# Patient Record
Sex: Male | Born: 1975 | Race: Black or African American | Hispanic: No | Marital: Married | Smoking: Never smoker
Health system: Southern US, Community
[De-identification: ages and names within clinical notes are randomized; demographics above are authoritative.]

## PROBLEM LIST (undated history)

## (undated) DIAGNOSIS — J342 Deviated nasal septum: Secondary | ICD-10-CM

---

## 2014-05-28 ENCOUNTER — Encounter (HOSPITAL_COMMUNITY): Payer: Self-pay | Admitting: Emergency Medicine

## 2014-05-28 ENCOUNTER — Emergency Department (HOSPITAL_COMMUNITY)
Admission: EM | Admit: 2014-05-28 | Discharge: 2014-05-29 | Disposition: A | Discharge status: Home / Self Care | Payer: 59 | Attending: Emergency Medicine | Admitting: Emergency Medicine

## 2014-05-28 DIAGNOSIS — S161XXA Strain of muscle, fascia and tendon at neck level, initial encounter: Secondary | ICD-10-CM

## 2014-05-28 DIAGNOSIS — S80212A Abrasion, left knee, initial encounter: Secondary | ICD-10-CM | POA: Insufficient documentation

## 2014-05-28 DIAGNOSIS — Y92410 Unspecified street and highway as the place of occurrence of the external cause: Secondary | ICD-10-CM | POA: Diagnosis not present

## 2014-05-28 DIAGNOSIS — Y9389 Activity, other specified: Secondary | ICD-10-CM | POA: Insufficient documentation

## 2014-05-28 DIAGNOSIS — Y998 Other external cause status: Secondary | ICD-10-CM | POA: Insufficient documentation

## 2014-05-28 DIAGNOSIS — S6991XA Unspecified injury of right wrist, hand and finger(s), initial encounter: Secondary | ICD-10-CM | POA: Insufficient documentation

## 2014-05-28 DIAGNOSIS — S3992XA Unspecified injury of lower back, initial encounter: Secondary | ICD-10-CM | POA: Diagnosis not present

## 2014-05-28 DIAGNOSIS — S199XXA Unspecified injury of neck, initial encounter: Secondary | ICD-10-CM | POA: Diagnosis present
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
## 2014-05-28 NOTE — ED Notes (Signed)
C- collar applied at triage . 

## 2014-05-29 ENCOUNTER — Emergency Department (HOSPITAL_COMMUNITY): Payer: 59

## 2014-05-29 DIAGNOSIS — S161XXA Strain of muscle, fascia and tendon at neck level, initial encounter: Secondary | ICD-10-CM | POA: Diagnosis not present

## 2014-05-29 MED ORDER — HYDROCODONE-ACETAMINOPHEN 5-325 MG PO TABS: 1.0000 | ORAL_TABLET | Freq: Four times a day (QID) | ORAL | Status: DC | PRN

## 2014-05-29 MED ORDER — HYDROCODONE-ACETAMINOPHEN 5-325 MG PO TABS
2.0000 | ORAL_TABLET | Freq: Once | ORAL | Status: AC
Start: 2014-05-29 — End: 2014-05-29
  Administered 2014-05-29: 2 via ORAL
  Filled 2014-05-29: qty 2
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
## 2014-05-29 NOTE — Discharge Instructions (Signed)
Cervical Strain and Sprain (Whiplash) °with Rehab °Cervical strain and sprain are injuries that commonly occur with "whiplash" injuries. Whiplash occurs when the neck is forcefully whipped backward or forward, such as during a motor vehicle accident or during contact sports. The muscles, ligaments, tendons, discs, and nerves of the neck are susceptible to injury when this occurs. °RISK FACTORS °Risk of having a whiplash injury increases if: °· Osteoarthritis of the spine. °· Situations that make head or neck accidents or trauma more likely. °· High-risk sports (football, rugby, wrestling, hockey, auto racing, gymnastics, diving, contact karate, or boxing). °· Poor strength and flexibility of the neck. °· Previous neck injury. °· Poor tackling technique. °· Improperly fitted or padded equipment. °SYMPTOMS  °· Pain or stiffness in the front or back of neck or both. °· Symptoms may present immediately or up to 24 hours after injury. °· Dizziness, headache, nausea, and vomiting. °· Muscle spasm with soreness and stiffness in the neck. °· Tenderness and swelling at the injury site. °PREVENTION °· Learn and use proper technique (avoid tackling with the head, spearing, and head-butting; use proper falling techniques to avoid landing on the head). °· Warm up and stretch properly before activity. °· Maintain physical fitness: °· Strength, flexibility, and endurance. °· Cardiovascular fitness. °· Wear properly fitted and padded protective equipment, such as padded soft collars, for participation in contact sports. °PROGNOSIS  °Recovery from cervical strain and sprain injuries is dependent on the extent of the injury. These injuries are usually curable in 1 week to 3 months with appropriate treatment.  °RELATED COMPLICATIONS  °· Temporary numbness and weakness may occur if the nerve roots are damaged, and this may persist until the nerve has completely healed. °· Chronic pain due to frequent recurrence of  symptoms. °· Prolonged healing, especially if activity is resumed too soon (before complete recovery). °TREATMENT  °Treatment initially involves the use of ice and medication to help reduce pain and inflammation. It is also important to perform strengthening and stretching exercises and modify activities that worsen symptoms so the injury does not get worse. These exercises may be performed at home or with a therapist. For patients who experience severe symptoms, a soft, padded collar may be recommended to be worn around the neck.  °Improving your posture may help reduce symptoms. Posture improvement includes pulling your chin and abdomen in while sitting or standing. If you are sitting, sit in a firm chair with your buttocks against the back of the chair. While sleeping, try replacing your pillow with a small towel rolled to 2 inches in diameter, or use a cervical pillow or soft cervical collar. Poor sleeping positions delay healing.  °For patients with nerve root damage, which causes numbness or weakness, the use of a cervical traction apparatus may be recommended. Surgery is rarely necessary for these injuries. However, cervical strain and sprains that are present at birth (congenital) may require surgery. °MEDICATION  °· If pain medication is necessary, nonsteroidal anti-inflammatory medications, such as aspirin and ibuprofen, or other minor pain relievers, such as acetaminophen, are often recommended. °· Do not take pain medication for 7 days before surgery. °· Prescription pain relievers may be given if deemed necessary by your caregiver. Use only as directed and only as much as you need. °HEAT AND COLD:  °· Cold treatment (icing) relieves pain and reduces inflammation. Cold treatment should be applied for 10 to 15 minutes every 2 to 3 hours for inflammation and pain and immediately after any activity that aggravates   your symptoms. Use ice packs or an ice massage. °· Heat treatment may be used prior to  performing the stretching and strengthening activities prescribed by your caregiver, physical therapist, or athletic trainer. Use a heat pack or a warm soak. °SEEK MEDICAL CARE IF:  °· Symptoms get worse or do not improve in 2 weeks despite treatment. °· New, unexplained symptoms develop (drugs used in treatment may produce side effects). °EXERCISES °RANGE OF MOTION (ROM) AND STRETCHING EXERCISES - Cervical Strain and Sprain °These exercises may help you when beginning to rehabilitate your injury. In order to successfully resolve your symptoms, you must improve your posture. These exercises are designed to help reduce the forward-head and rounded-shoulder posture which contributes to this condition. Your symptoms may resolve with or without further involvement from your physician, physical therapist or athletic trainer. While completing these exercises, remember:  °· Restoring tissue flexibility helps normal motion to return to the joints. This allows healthier, less painful movement and activity. °· An effective stretch should be held for at least 20 seconds, although you may need to begin with shorter hold times for comfort. °· A stretch should never be painful. You should only feel a gentle lengthening or release in the stretched tissue. °STRETCH- Axial Extensors °· Lie on your back on the floor. You may bend your knees for comfort. Place a rolled-up hand towel or dish towel, about 2 inches in diameter, under the part of your head that makes contact with the floor. °· Gently tuck your chin, as if trying to make a "double chin," until you feel a gentle stretch at the base of your head. °· Hold __________ seconds. °Repeat __________ times. Complete this exercise __________ times per day.  °STRETCH - Axial Extension  °· Stand or sit on a firm surface. Assume a good posture: chest up, shoulders drawn back, abdominal muscles slightly tense, knees unlocked (if standing) and feet hip width apart. °· Slowly retract your  chin so your head slides back and your chin slightly lowers. Continue to look straight ahead. °· You should feel a gentle stretch in the back of your head. Be certain not to feel an aggressive stretch since this can cause headaches later. °· Hold for __________ seconds. °Repeat __________ times. Complete this exercise __________ times per day. °STRETCH - Cervical Side Bend  °· Stand or sit on a firm surface. Assume a good posture: chest up, shoulders drawn back, abdominal muscles slightly tense, knees unlocked (if standing) and feet hip width apart. °· Without letting your nose or shoulders move, slowly tip your right / left ear to your shoulder until your feel a gentle stretch in the muscles on the opposite side of your neck. °· Hold __________ seconds. °Repeat __________ times. Complete this exercise __________ times per day. °STRETCH - Cervical Rotators  °· Stand or sit on a firm surface. Assume a good posture: chest up, shoulders drawn back, abdominal muscles slightly tense, knees unlocked (if standing) and feet hip width apart. °· Keeping your eyes level with the ground, slowly turn your head until you feel a gentle stretch along the back and opposite side of your neck. °· Hold __________ seconds. °Repeat __________ times. Complete this exercise __________ times per day. °RANGE OF MOTION - Neck Circles  °· Stand or sit on a firm surface. Assume a good posture: chest up, shoulders drawn back, abdominal muscles slightly tense, knees unlocked (if standing) and feet hip width apart. °· Gently roll your head down and around from the   back of one shoulder to the back of the other. The motion should never be forced or painful. °· Repeat the motion 10-20 times, or until you feel the neck muscles relax and loosen. °Repeat __________ times. Complete the exercise __________ times per day. °STRENGTHENING EXERCISES - Cervical Strain and Sprain °These exercises may help you when beginning to rehabilitate your injury. They may  resolve your symptoms with or without further involvement from your physician, physical therapist, or athletic trainer. While completing these exercises, remember:  °· Muscles can gain both the endurance and the strength needed for everyday activities through controlled exercises. °· Complete these exercises as instructed by your physician, physical therapist, or athletic trainer. Progress the resistance and repetitions only as guided. °· You may experience muscle soreness or fatigue, but the pain or discomfort you are trying to eliminate should never worsen during these exercises. If this pain does worsen, stop and make certain you are following the directions exactly. If the pain is still present after adjustments, discontinue the exercise until you can discuss the trouble with your clinician. °STRENGTH - Cervical Flexors, Isometric °· Face a wall, standing about 6 inches away. Place a small pillow, a ball about 6-8 inches in diameter, or a folded towel between your forehead and the wall. °· Slightly tuck your chin and gently push your forehead into the soft object. Push only with mild to moderate intensity, building up tension gradually. Keep your jaw and forehead relaxed. °· Hold 10 to 20 seconds. Keep your breathing relaxed. °· Release the tension slowly. Relax your neck muscles completely before you start the next repetition. °Repeat __________ times. Complete this exercise __________ times per day. °STRENGTH- Cervical Lateral Flexors, Isometric  °· Stand about 6 inches away from a wall. Place a small pillow, a ball about 6-8 inches in diameter, or a folded towel between the side of your head and the wall. °· Slightly tuck your chin and gently tilt your head into the soft object. Push only with mild to moderate intensity, building up tension gradually. Keep your jaw and forehead relaxed. °· Hold 10 to 20 seconds. Keep your breathing relaxed. °· Release the tension slowly. Relax your neck muscles completely  before you start the next repetition. °Repeat __________ times. Complete this exercise __________ times per day. °STRENGTH - Cervical Extensors, Isometric  °· Stand about 6 inches away from a wall. Place a small pillow, a ball about 6-8 inches in diameter, or a folded towel between the back of your head and the wall. °· Slightly tuck your chin and gently tilt your head back into the soft object. Push only with mild to moderate intensity, building up tension gradually. Keep your jaw and forehead relaxed. °· Hold 10 to 20 seconds. Keep your breathing relaxed. °· Release the tension slowly. Relax your neck muscles completely before you start the next repetition. °Repeat __________ times. Complete this exercise __________ times per day. °POSTURE AND BODY MECHANICS CONSIDERATIONS - Cervical Strain and Sprain °Keeping correct posture when sitting, standing or completing your activities will reduce the stress put on different body tissues, allowing injured tissues a chance to heal and limiting painful experiences. The following are general guidelines for improved posture. Your physician or physical therapist will provide you with any instructions specific to your needs. While reading these guidelines, remember: °· The exercises prescribed by your provider will help you have the flexibility and strength to maintain correct postures. °· The correct posture provides the optimal environment for your joints to   work. All of your joints have less wear and tear when properly supported by a spine with good posture. This means you will experience a healthier, less painful body. °· Correct posture must be practiced with all of your activities, especially prolonged sitting and standing. Correct posture is as important when doing repetitive low-stress activities (typing) as it is when doing a single heavy-load activity (lifting). °PROLONGED STANDING WHILE SLIGHTLY LEANING FORWARD °When completing a task that requires you to lean  forward while standing in one place for a long time, place either foot up on a stationary 2- to 4-inch high object to help maintain the best posture. When both feet are on the ground, the low back tends to lose its slight inward curve. If this curve flattens (or becomes too large), then the back and your other joints will experience too much stress, fatigue more quickly, and can cause pain.  °RESTING POSITIONS °Consider which positions are most painful for you when choosing a resting position. If you have pain with flexion-based activities (sitting, bending, stooping, squatting), choose a position that allows you to rest in a less flexed posture. You would want to avoid curling into a fetal position on your side. If your pain worsens with extension-based activities (prolonged standing, working overhead), avoid resting in an extended position such as sleeping on your stomach. Most people will find more comfort when they rest with their spine in a more neutral position, neither too rounded nor too arched. Lying on a non-sagging bed on your side with a pillow between your knees, or on your back with a pillow under your knees will often provide some relief. Keep in mind, being in any one position for a prolonged period of time, no matter how correct your posture, can still lead to stiffness. °WALKING °Walk with an upright posture. Your ears, shoulders, and hips should all line up. °OFFICE WORK °When working at a desk, create an environment that supports good, upright posture. Without extra support, muscles fatigue and lead to excessive strain on joints and other tissues. °CHAIR: °· A chair should be able to slide under your desk when your back makes contact with the back of the chair. This allows you to work closely. °· The chair's height should allow your eyes to be level with the upper part of your monitor and your hands to be slightly lower than your elbows. °· Body position: °¨ Your feet should make contact with the  floor. If this is not possible, use a foot rest. °¨ Keep your ears over your shoulders. This will reduce stress on your neck and low back. °Document Released: 04/17/2005 Document Revised: 09/01/2013 Document Reviewed: 07/30/2008 °[employer]® Patient Information ©2015 [employer]. This information is not intended to replace advice given to you by your health care provider. Make sure you discuss any questions you have with your health care provider. °Motor Vehicle Collision °It is common to have multiple bruises and sore muscles after a motor vehicle collision (MVC). These tend to feel worse for the first 24 hours. You may have the most stiffness and soreness over the first several hours. You may also feel worse when you wake up the first morning after your collision. After this point, you will usually begin to improve with each day. The speed of improvement often depends on the severity of the collision, the number of injuries, and the location and nature of these injuries. °HOME CARE INSTRUCTIONS °· Put ice on the injured area. °¨ Put ice in a   plastic bag. °¨ Place a towel between your skin and the bag. °¨ Leave the ice on for 15-20 minutes, 3-4 times a day, or as directed by your health care provider. °· Drink enough fluids to keep your urine clear or pale yellow. Do not drink alcohol. °· Take a warm shower or bath once or twice a day. This will increase blood flow to sore muscles. °· You may return to activities as directed by your caregiver. Be careful when lifting, as this may aggravate neck or back pain. °· Only take over-the-counter or prescription medicines for pain, discomfort, or fever as directed by your caregiver. Do not use aspirin. This may increase bruising and bleeding. °SEEK IMMEDIATE MEDICAL CARE IF: °· You have numbness, tingling, or weakness in the arms or legs. °· You develop severe headaches not relieved with medicine. °· You have severe neck pain, especially tenderness in the middle of the back  of your neck. °· You have changes in bowel or bladder control. °· There is increasing pain in any area of the body. °· You have shortness of breath, light-headedness, dizziness, or fainting. °· You have chest pain. °· You feel sick to your stomach (nauseous), throw up (vomit), or sweat. °· You have increasing abdominal discomfort. °· There is blood in your urine, stool, or vomit. °· You have pain in your shoulder (shoulder strap areas). °· You feel your symptoms are getting worse. °MAKE SURE YOU: °· Understand these instructions. °· Will watch your condition. °· Will get help right away if you are not doing well or get worse. °Document Released: 04/17/2005 Document Revised: 09/01/2013 Document Reviewed: 09/14/2010 °[employer]® Patient Information ©2015 [employer]. This information is not intended to replace advice given to you by your health care provider. Make sure you discuss any questions you have with your health care provider. ° °
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
## 2021-02-10 ENCOUNTER — Other Ambulatory Visit: Payer: Self-pay

## 2021-02-10 ENCOUNTER — Ambulatory Visit (INDEPENDENT_AMBULATORY_CARE_PROVIDER_SITE_OTHER): Payer: BC Managed Care – PPO | Admitting: Medical

## 2021-02-10 VITALS — BP 120/80 | HR 85 | Ht 74.0 in | Wt 187.0 lb

## 2021-02-10 DIAGNOSIS — Z23 Encounter for immunization: Secondary | ICD-10-CM

## 2021-02-10 DIAGNOSIS — J309 Allergic rhinitis, unspecified: Secondary | ICD-10-CM | POA: Diagnosis not present

## 2021-02-10 DIAGNOSIS — Z0000 Encounter for general adult medical examination without abnormal findings: Secondary | ICD-10-CM

## 2021-02-10 DIAGNOSIS — J3489 Other specified disorders of nose and nasal sinuses: Secondary | ICD-10-CM

## 2021-02-10 DIAGNOSIS — Z13220 Encounter for screening for lipoid disorders: Secondary | ICD-10-CM

## 2021-02-10 MED ORDER — AMOXICILLIN 875 MG PO TABS: 875.0000 mg | ORAL_TABLET | Freq: Two times a day (BID) | ORAL | 0 refills | Status: AC

## 2021-02-10 NOTE — Progress Notes (Signed)
Subjective:   HPI  Juan Gutierrez is a 45 y.o. male who presents for Chief Complaint  Patient presents with   get established    Get established. Sinus issues since march/April- tried over the counter meds but still having pressure    Patient Care Team: Hiyab Nhem, Cleda Mccreedy as PCP - General (Family Medicine) Sees dentist Sees eye doctor  Concerns: New patient today to establish care.  No routine care in 10 years or more.  He is in good health except for sinus issues.  Ever since he and his family moved to [place] he has had problems with allergies.  Since March or April of this year he has had continual problems with head congestion, nasal congestion.  He has tried over-the-counter antihistamines, nasal sprays, decongestants, but still has continued head pressure and nasal congestion.  He has had a sinus infection in the past.  No nosebleeds.  No significant headaches.  Reviewed their medical, surgical, family, social, medication, and allergy history and updated chart as appropriate.  No past medical history on file.  No past surgical history on file.  No family history on file.   Current Outpatient Medications:    amoxicillin (AMOXIL) 875 MG tablet, Take 1 tablet (875 mg total) by mouth 2 (two) times daily for 10 days., Disp: 20 tablet, Rfl: 0   Fexofenadine-Pseudoephedrine (ALLEGRA-D PO), Take by mouth., Disp: , Rfl:    fluticasone (FLONASE) 50 MCG/ACT nasal spray, Place into both nostrils daily., Disp: , Rfl:    Oxymetazoline HCl (NASAL SPRAY NA), Place into the nose., Disp: , Rfl:   No Known Allergies   Review of Systems Constitutional: -fever, -chills, -sweats, -unexpected weight change, -decreased appetite, -fatigue Allergy: -sneezing, -itching, -congestion Dermatology: -changing moles, --rash, -lumps ENT: -runny nose, -ear pain, -sore throat, -hoarseness, +sinus pain, -teeth pain, - ringing in ears, -hearing loss, -nosebleeds Cardiology: -chest pain,  -palpitations, -swelling, -difficulty breathing when lying flat, -waking up short of breath Respiratory: -cough, -shortness of breath, -difficulty breathing with exercise or exertion, -wheezing, -coughing up blood Gastroenterology: -abdominal pain, -nausea, -vomiting, -diarrhea, -constipation, -blood in stool, -changes in bowel movement, -difficulty swallowing or eating Hematology: -bleeding, -bruising  Musculoskeletal: -joint aches, -muscle aches, -joint swelling, -back pain, -neck pain, -cramping, -changes in gait Ophthalmology: denies vision changes, eye redness, itching, discharge Urology: -burning with urination, -difficulty urinating, -blood in urine, -urinary frequency, -urgency, -incontinence Neurology: -headache, -weakness, -tingling, -numbness, -memory loss, -falls, -dizziness Psychology: -depressed mood, -agitation, -sleep problems Male GU: no testicular mass, pain, no lymph nodes swollen, no swelling, no rash.  Depression screen [facility] 2/9 02/10/2021  Decreased Interest 0  Down, Depressed, Hopeless 0  PHQ - 2 Score 0        Objective:  BP 120/80   Pulse 85   Ht 6\' 2"  (1.88 m)   Wt 187 lb (84.8 kg)   BMI 24.01 kg/m   General appearance: alert, no distress, WD/WN, African American male Skin: unremarkable HEENT: normocephalic, conjunctiva/corneas normal, sclerae anicteric, PERRLA, EOMi, nares patent, +turbinate edema bilat, pink mucosa, pharynx normal Oral cavity: MMM, tongue normal, teeth normal Neck: supple, no lymphadenopathy, no thyromegaly, no masses, normal ROM, no bruits Chest: non tender, normal shape and expansion Heart: RRR, normal S1, S2, no murmurs Lungs: CTA bilaterally, no wheezes, rhonchi, or rales Abdomen: +bs, soft, non tender, non distended, no masses, no hepatomegaly, no splenomegaly, no bruits Back: non tender, normal ROM, no scoliosis Musculoskeletal: upper extremities non tender, no obvious deformity, normal ROM throughout, lower extremities  non  tender, no obvious deformity, normal ROM throughout Extremities: no edema, no cyanosis, no clubbing Pulses: 2+ symmetric, upper and lower extremities, normal cap refill Neurological: alert, oriented x 3, CN2-12 intact, strength normal upper extremities and lower extremities, sensation normal throughout, DTRs 2+ throughout, no cerebellar signs, gait normal Psychiatric: normal affect, behavior normal, pleasant  GU: normal male external genitalia,circumcised, nontender, no masses, no hernia, no lymphadenopathy Rectal: deferred  Assessment and Plan :   Encounter Diagnoses  Name Primary?   Encounter for health maintenance examination in adult Yes   Need for influenza vaccination    Sinus pressure    Chronic allergic rhinitis    Screening for lipid disorders     This visit was a preventative care visit, also known as wellness visit or routine physical.   Topics typically include healthy lifestyle, diet, exercise, preventative care, vaccinations, sick and well care, proper use of emergency dept and after hours care, as well as other concerns.     Recommendations: Continue to return yearly for your annual wellness and preventative care visits.  This gives [place] a chance to discuss healthy lifestyle, exercise, vaccinations, review your chart record, and perform screenings where appropriate.  I recommend you see your eye doctor yearly for routine vision care.  I recommend you see your dentist yearly for routine dental care including hygiene visits twice yearly.   Vaccination recommendations were reviewed Immunization History  Administered Date(s) Administered   Influenza,inj,Quad PF,6+ Mos 02/10/2021   PFIZER(Purple Top)SARS-COV-2 Vaccination 01/09/2021, 01/30/2021   Counseled on the influenza virus vaccine.  Vaccine information sheet given.  Influenza vaccine given after consent obtained.  Advised updated tetanus.   He will consider    Screening for cancer: Colon cancer screening: Age  89  Testicular cancer screening You should do a monthly self testicular exam if you are between 65-58 years old  We discussed PSA, prostate exam, and prostate cancer screening risks/benefits.   Age 89  Skin cancer screening: Check your skin regularly for new changes, growing lesions, or other lesions of concern Come in for evaluation if you have skin lesions of concern.  Lung cancer screening: If you have a greater than 20 pack year history of tobacco use, then you may qualify for lung cancer screening with a chest CT scan.   Please call your insurance company to inquire about coverage for this test.  We currently don't have screenings for other cancers besides breast, cervical, colon, and lung cancers.  If you have a strong family history of cancer or have other cancer screening concerns, please let me know.    Bone health: Get at least 150 minutes of aerobic exercise weekly Get weight bearing exercise at least once weekly Bone density test:  A bone density test is an imaging test that uses a type of X-ray to measure the amount of calcium and other minerals in your bones. The test may be used to diagnose or screen you for a condition that causes weak or thin bones (osteoporosis), predict your risk for a broken bone (fracture), or determine how well your osteoporosis treatment is working. The bone density test is recommended for females 65 and older, or females or males <65 if certain risk factors such as thyroid disease, long term use of steroids such as for asthma or rheumatological issues, vitamin D deficiency, estrogen deficiency, family history of osteoporosis, self or family history of fragility fracture in first degree relative.    Heart health: Get at least 150 minutes of  aerobic exercise weekly Limit alcohol It is important to maintain a healthy blood pressure and healthy cholesterol numbers  Heart disease screening: Screening for heart disease includes screening for blood  pressure, fasting lipids, glucose/diabetes screening, BMI height to weight ratio, reviewed of smoking status, physical activity, and diet.    Goals include blood pressure 120/80 or less, maintaining a healthy lipid/cholesterol profile, preventing diabetes or keeping diabetes numbers under good control, not smoking or using tobacco products, exercising most days per week or at least 150 minutes per week of exercise, and eating healthy variety of fruits and vegetables, healthy oils, and avoiding unhealthy food choices like fried food, fast food, high sugar and high cholesterol foods.    Other tests may possibly include EKG test, CT coronary calcium score, echocardiogram, exercise treadmill stress test.    Medical care options: I recommend you continue to seek care here first for routine care.  We try really hard to have available appointments Monday through Friday daytime hours for sick visits, acute visits, and physicals.  Urgent care should be used for after hours and weekends for significant issues that cannot wait till the next day.  The emergency department should be used for significant potentially life-threatening emergencies.  The emergency department is expensive, can often have long wait times for less significant concerns, so try to utilize primary care, urgent care, or telemedicine when possible to avoid unnecessary trips to the emergency department.  Virtual visits and telemedicine have been introduced since the pandemic started in 2020, and can be convenient ways to receive medical care.  We offer virtual appointments as well to assist you in a variety of options to seek medical care.    Separate significant issues discussed: Chronic allergy and sinus problems-begin trial of amoxicillin.  Discussed proper use of nasal sprays.  Go back on nasal spray and allergy pill instead of decongestants.  If not much improved within the next 1.5 months recheck or we consider ear nose and throat  specialist or allergist   Return for fasting labs   Brigham was seen today for get established.  Diagnoses and all orders for this visit:  Encounter for health maintenance examination in adult -     Comprehensive metabolic panel; Future -     CBC with Differential/Platelet; Future -     Lipid panel; Future -     TSH; Future -     Urinalysis -     Cancel: POCT Urinalysis DIP (Proadvantage Device)  Need for influenza vaccination -     Flu Vaccine QUAD 6+ mos PF IM (Fluarix Quad PF)  Sinus pressure  Chronic allergic rhinitis  Screening for lipid disorders -     Lipid panel; Future  Other orders -     amoxicillin (AMOXIL) 875 MG tablet; Take 1 tablet (875 mg total) by mouth 2 (two) times daily for 10 days.   Follow-up pending labs, yearly for physical

## 2021-02-11 LAB — URINALYSIS
Bilirubin, UA: NEGATIVE
Glucose, UA: NEGATIVE
Ketones, UA: NEGATIVE
Leukocytes,UA: NEGATIVE
Nitrite, UA: NEGATIVE
Protein,UA: NEGATIVE
RBC, UA: NEGATIVE
Specific Gravity, UA: 1.017 (ref 1.005–1.030)
Urobilinogen, Ur: 0.2 mg/dL (ref 0.2–1.0)
pH, UA: 7 (ref 5.0–7.5)

## 2021-02-17 ENCOUNTER — Other Ambulatory Visit: Payer: Self-pay

## 2021-02-17 ENCOUNTER — Other Ambulatory Visit: Payer: BC Managed Care – PPO

## 2021-02-17 DIAGNOSIS — Z0000 Encounter for general adult medical examination without abnormal findings: Secondary | ICD-10-CM | POA: Diagnosis not present

## 2021-02-17 DIAGNOSIS — Z13220 Encounter for screening for lipoid disorders: Secondary | ICD-10-CM | POA: Diagnosis not present

## 2021-02-18 LAB — CBC WITH DIFFERENTIAL/PLATELET
Basophils Absolute: 0 10*3/uL (ref 0.0–0.2)
Basos: 1 %
EOS (ABSOLUTE): 0.2 10*3/uL (ref 0.0–0.4)
Eos: 4 %
Hematocrit: 42.3 % (ref 37.5–51.0)
Hemoglobin: 13.9 g/dL (ref 13.0–17.7)
Immature Grans (Abs): 0 10*3/uL (ref 0.0–0.1)
Immature Granulocytes: 0 %
Lymphocytes Absolute: 1.5 10*3/uL (ref 0.7–3.1)
Lymphs: 33 %
MCH: 27.6 pg (ref 26.6–33.0)
MCHC: 32.9 g/dL (ref 31.5–35.7)
MCV: 84 fL (ref 79–97)
Monocytes Absolute: 0.5 10*3/uL (ref 0.1–0.9)
Monocytes: 11 %
Neutrophils Absolute: 2.2 10*3/uL (ref 1.4–7.0)
Neutrophils: 51 %
Platelets: 301 10*3/uL (ref 150–450)
RBC: 5.03 x10E6/uL (ref 4.14–5.80)
RDW: 12.8 % (ref 11.6–15.4)
WBC: 4.4 10*3/uL (ref 3.4–10.8)

## 2021-02-18 LAB — LIPID PANEL
Chol/HDL Ratio: 3.8 ratio (ref 0.0–5.0)
Cholesterol, Total: 207 mg/dL — ABNORMAL HIGH (ref 100–199)
HDL: 54 mg/dL (ref >39)
LDL Chol Calc (NIH): 134 mg/dL — ABNORMAL HIGH (ref 0–99)
Triglycerides: 108 mg/dL (ref 0–149)
VLDL Cholesterol Cal: 19 mg/dL (ref 5–40)

## 2021-02-18 LAB — COMPREHENSIVE METABOLIC PANEL
ALT: 26 IU/L (ref 0–44)
AST: 31 IU/L (ref 0–40)
Albumin/Globulin Ratio: 1.9 (ref 1.2–2.2)
Albumin: 5 g/dL (ref 4.0–5.0)
Alkaline Phosphatase: 55 IU/L (ref 44–121)
BUN/Creatinine Ratio: 10 (ref 9–20)
BUN: 12 mg/dL (ref 6–24)
Bilirubin Total: 0.3 mg/dL (ref 0.0–1.2)
CO2: 25 mmol/L (ref 20–29)
Calcium: 10.1 mg/dL (ref 8.7–10.2)
Chloride: 99 mmol/L (ref 96–106)
Creatinine, Ser: 1.19 mg/dL (ref 0.76–1.27)
Globulin, Total: 2.6 g/dL (ref 1.5–4.5)
Glucose: 92 mg/dL (ref 70–99)
Potassium: 4.6 mmol/L (ref 3.5–5.2)
Sodium: 138 mmol/L (ref 134–144)
Total Protein: 7.6 g/dL (ref 6.0–8.5)
eGFR: 77 mL/min/{1.73_m2} (ref >59)

## 2021-02-18 LAB — TSH: TSH: 2.04 u[IU]/mL (ref 0.450–4.500)

## 2021-02-23 ENCOUNTER — Other Ambulatory Visit: Payer: Self-pay

## 2021-02-23 ENCOUNTER — Ambulatory Visit (INDEPENDENT_AMBULATORY_CARE_PROVIDER_SITE_OTHER): Payer: BC Managed Care – PPO | Admitting: Medical

## 2021-02-23 ENCOUNTER — Ambulatory Visit: Payer: BC Managed Care – PPO | Admitting: Medical

## 2021-02-23 VITALS — BP 110/70 | Wt 183.0 lb

## 2021-02-23 DIAGNOSIS — J343 Hypertrophy of nasal turbinates: Secondary | ICD-10-CM

## 2021-02-23 DIAGNOSIS — J309 Allergic rhinitis, unspecified: Secondary | ICD-10-CM

## 2021-02-23 MED ORDER — IPRATROPIUM BROMIDE 0.06 % NA SOLN: 1.0000 | Freq: Three times a day (TID) | NASAL | 0 refills | Status: DC

## 2021-02-23 NOTE — Progress Notes (Signed)
Subjective:  Juan Gutierrez is a 45 y.o. male who presents for Chief Complaint  Patient presents with   recheck sinuses    Recheck sinuses- nothing has changed . Pressure and would like referral to ENT     Here for recheck on allergies and sinuses.  I saw him recently as a new patient for physical.  At that time the was having chronic allergy problems ever since moving to [place] but was also having more of a sinus pressure and thick mucus that had changed recently.  Last visit he started on amoxicillin, discussed proper use of Flonase and continuing allergy pill.  He is doing all those things.  He finished the antibiotic.  He even has been doing Nettie pot.  He notes zero improvement.  no other aggravating or relieving factors.  No other c/o.  The following portions of the patient's history were reviewed and updated as appropriate: allergies, current medications, past family history, past medical history, past social history, past surgical history and problem list.  ROS Otherwise as in subjective above  Objective: BP 110/70   Wt 183 lb (83 kg)   BMI 23.50 kg/m   General appearance: alert, no distress, well developed, well nourished HEENT: normocephalic, sclerae anicteric, conjunctiva pink and moist, TMs pearly, nares with significant turbinate hypertrophy, but no discharge or erythema, pharynx normal Oral cavity: MMM, no lesions Neck: supple, no lymphadenopathy, no thyromegaly, no masses   Assessment: Encounter Diagnoses  Name Primary?   Nasal turbinate hypertrophy Yes   Chronic allergic rhinitis      Plan: Continue allergy pill, continue Nettie pot, switch to Atrovent or can use Atrovent nasal and Flonase alternating at different times per day.  Hopefully this will give some benefit.  We will go ahead and refer to ear nose and throat  Juan Gutierrez was seen today for recheck sinuses.  Diagnoses and all orders for this visit:  Nasal turbinate hypertrophy -     Ambulatory  referral to ENT  Chronic allergic rhinitis -     Ambulatory referral to ENT  Other orders -     ipratropium (ATROVENT) 0.06 % nasal spray; Place 1 spray into both nostrils 3 (three) times daily.    Follow up: prn

## 2021-03-15 DIAGNOSIS — J342 Deviated nasal septum: Secondary | ICD-10-CM | POA: Diagnosis not present

## 2021-03-15 DIAGNOSIS — J310 Chronic rhinitis: Secondary | ICD-10-CM | POA: Diagnosis not present

## 2021-03-15 DIAGNOSIS — J343 Hypertrophy of nasal turbinates: Secondary | ICD-10-CM | POA: Diagnosis not present

## 2021-03-23 ENCOUNTER — Other Ambulatory Visit: Payer: Self-pay | Admitting: Otolaryngology

## 2021-04-04 ENCOUNTER — Encounter (HOSPITAL_BASED_OUTPATIENT_CLINIC_OR_DEPARTMENT_OTHER): Payer: Self-pay | Admitting: Otolaryngology

## 2021-04-04 ENCOUNTER — Other Ambulatory Visit: Payer: Self-pay

## 2021-04-11 ENCOUNTER — Encounter (HOSPITAL_BASED_OUTPATIENT_CLINIC_OR_DEPARTMENT_OTHER)
Admission: RE | Disposition: A | Discharge status: Home / Self Care | Payer: Self-pay | Source: Home / Self Care | Attending: Otolaryngology

## 2021-04-11 ENCOUNTER — Other Ambulatory Visit: Payer: Self-pay

## 2021-04-11 ENCOUNTER — Ambulatory Visit (HOSPITAL_BASED_OUTPATIENT_CLINIC_OR_DEPARTMENT_OTHER)
Admission: RE | Admit: 2021-04-11 | Discharge: 2021-04-11 | Disposition: A | Discharge status: Home / Self Care | Payer: BC Managed Care – PPO | Attending: Otolaryngology | Admitting: Otolaryngology

## 2021-04-11 ENCOUNTER — Encounter (HOSPITAL_BASED_OUTPATIENT_CLINIC_OR_DEPARTMENT_OTHER): Payer: Self-pay | Admitting: Otolaryngology

## 2021-04-11 ENCOUNTER — Ambulatory Visit (HOSPITAL_BASED_OUTPATIENT_CLINIC_OR_DEPARTMENT_OTHER): Payer: BC Managed Care – PPO | Admitting: Certified Registered"

## 2021-04-11 DIAGNOSIS — J310 Chronic rhinitis: Secondary | ICD-10-CM | POA: Insufficient documentation

## 2021-04-11 DIAGNOSIS — J3489 Other specified disorders of nose and nasal sinuses: Secondary | ICD-10-CM | POA: Diagnosis not present

## 2021-04-11 DIAGNOSIS — J342 Deviated nasal septum: Secondary | ICD-10-CM | POA: Diagnosis not present

## 2021-04-11 DIAGNOSIS — J343 Hypertrophy of nasal turbinates: Secondary | ICD-10-CM | POA: Insufficient documentation

## 2021-04-11 HISTORY — PX: NASAL SEPTOPLASTY W/ TURBINOPLASTY: SHX2070

## 2021-04-11 HISTORY — DX: Deviated nasal septum: J34.2

## 2021-04-11 SURGERY — SEPTOPLASTY, NOSE, WITH NASAL TURBINATE REDUCTION
Anesthesia: General | Site: Nose | Laterality: Bilateral

## 2021-04-11 MED ORDER — DEXAMETHASONE SODIUM PHOSPHATE 10 MG/ML IJ SOLN
INTRAMUSCULAR | Status: AC
  Filled 2021-04-11: qty 1

## 2021-04-11 MED ORDER — ROCURONIUM BROMIDE 100 MG/10ML IV SOLN
INTRAVENOUS | Status: DC | PRN
  Administered 2021-04-11: 50 mg via INTRAVENOUS

## 2021-04-11 MED ORDER — MIDAZOLAM HCL 2 MG/2ML IJ SOLN
INTRAMUSCULAR | Status: AC
  Filled 2021-04-11: qty 2

## 2021-04-11 MED ORDER — MUPIROCIN 2 % EX OINT
TOPICAL_OINTMENT | CUTANEOUS | Status: AC
  Filled 2021-04-11: qty 22

## 2021-04-11 MED ORDER — HYDROMORPHONE HCL 1 MG/ML IJ SOLN
0.2500 mg | INTRAMUSCULAR | Status: DC | PRN
  Administered 2021-04-11: 0.5 mg via INTRAVENOUS

## 2021-04-11 MED ORDER — OXYCODONE HCL 5 MG PO TABS: 5.0000 mg | ORAL_TABLET | Freq: Once | ORAL | Status: DC | PRN

## 2021-04-11 MED ORDER — OXYCODONE HCL 5 MG/5ML PO SOLN: 5.0000 mg | Freq: Once | ORAL | Status: DC | PRN

## 2021-04-11 MED ORDER — FENTANYL CITRATE (PF) 100 MCG/2ML IJ SOLN
INTRAMUSCULAR | Status: AC
  Filled 2021-04-11: qty 2

## 2021-04-11 MED ORDER — LIDOCAINE HCL (CARDIAC) PF 100 MG/5ML IV SOSY
PREFILLED_SYRINGE | INTRAVENOUS | Status: DC | PRN
  Administered 2021-04-11: 100 mg via INTRAVENOUS

## 2021-04-11 MED ORDER — CEFAZOLIN SODIUM 1 G IJ SOLR
INTRAMUSCULAR | Status: AC
  Filled 2021-04-11: qty 20

## 2021-04-11 MED ORDER — MUPIROCIN 2 % EX OINT
TOPICAL_OINTMENT | CUTANEOUS | Status: DC | PRN
  Administered 2021-04-11: 1 via TOPICAL

## 2021-04-11 MED ORDER — LIDOCAINE-EPINEPHRINE 1 %-1:100000 IJ SOLN
INTRAMUSCULAR | Status: DC | PRN
  Administered 2021-04-11: 3 mL

## 2021-04-11 MED ORDER — ONDANSETRON HCL 4 MG/2ML IJ SOLN
INTRAMUSCULAR | Status: AC
  Filled 2021-04-11: qty 2

## 2021-04-11 MED ORDER — FENTANYL CITRATE (PF) 100 MCG/2ML IJ SOLN
INTRAMUSCULAR | Status: DC | PRN
  Administered 2021-04-11: 100 ug via INTRAVENOUS
  Administered 2021-04-11 (×2): 50 ug via INTRAVENOUS

## 2021-04-11 MED ORDER — PROMETHAZINE HCL 25 MG/ML IJ SOLN: 6.2500 mg | INTRAMUSCULAR | Status: DC | PRN

## 2021-04-11 MED ORDER — DEXMEDETOMIDINE (PRECEDEX) IN NS 20 MCG/5ML (4 MCG/ML) IV SYRINGE
PREFILLED_SYRINGE | INTRAVENOUS | Status: AC
  Filled 2021-04-11: qty 5

## 2021-04-11 MED ORDER — LIDOCAINE 2% (20 MG/ML) 5 ML SYRINGE
INTRAMUSCULAR | Status: AC
  Filled 2021-04-11: qty 5

## 2021-04-11 MED ORDER — OXYMETAZOLINE HCL 0.05 % NA SOLN
NASAL | Status: DC | PRN
  Administered 2021-04-11: 1 via TOPICAL

## 2021-04-11 MED ORDER — PROPOFOL 10 MG/ML IV BOLUS
INTRAVENOUS | Status: AC
  Filled 2021-04-11: qty 20

## 2021-04-11 MED ORDER — ROCURONIUM BROMIDE 10 MG/ML (PF) SYRINGE
PREFILLED_SYRINGE | INTRAVENOUS | Status: AC
  Filled 2021-04-11: qty 10

## 2021-04-11 MED ORDER — OXYMETAZOLINE HCL 0.05 % NA SOLN
NASAL | Status: AC
  Filled 2021-04-11: qty 30

## 2021-04-11 MED ORDER — MIDAZOLAM HCL 5 MG/5ML IJ SOLN
INTRAMUSCULAR | Status: DC | PRN
  Administered 2021-04-11: 2 mg via INTRAVENOUS

## 2021-04-11 MED ORDER — DEXMEDETOMIDINE (PRECEDEX) IN NS 20 MCG/5ML (4 MCG/ML) IV SYRINGE
PREFILLED_SYRINGE | INTRAVENOUS | Status: DC | PRN
  Administered 2021-04-11: 8 ug via INTRAVENOUS
  Administered 2021-04-11 (×3): 4 ug via INTRAVENOUS

## 2021-04-11 MED ORDER — SUGAMMADEX SODIUM 200 MG/2ML IV SOLN
INTRAVENOUS | Status: DC | PRN
  Administered 2021-04-11: 331.6 mg via INTRAVENOUS

## 2021-04-11 MED ORDER — HYDROMORPHONE HCL 1 MG/ML IJ SOLN
INTRAMUSCULAR | Status: AC
  Filled 2021-04-11: qty 0.5

## 2021-04-11 MED ORDER — SCOPOLAMINE 1 MG/3DAYS TD PT72
MEDICATED_PATCH | TRANSDERMAL | Status: AC
  Filled 2021-04-11: qty 1

## 2021-04-11 MED ORDER — LACTATED RINGERS IV SOLN: INTRAVENOUS | Status: DC

## 2021-04-11 MED ORDER — AMISULPRIDE (ANTIEMETIC) 5 MG/2ML IV SOLN: 10.0000 mg | Freq: Once | INTRAVENOUS | Status: DC | PRN

## 2021-04-11 MED ORDER — OXYCODONE-ACETAMINOPHEN 5-325 MG PO TABS: 1.0000 | ORAL_TABLET | ORAL | 0 refills | Status: AC | PRN

## 2021-04-11 MED ORDER — LIDOCAINE-EPINEPHRINE 1 %-1:100000 IJ SOLN
INTRAMUSCULAR | Status: AC
  Filled 2021-04-11: qty 2

## 2021-04-11 MED ORDER — DEXAMETHASONE SODIUM PHOSPHATE 4 MG/ML IJ SOLN
INTRAMUSCULAR | Status: DC | PRN
  Administered 2021-04-11: 10 mg via INTRAVENOUS

## 2021-04-11 MED ORDER — AMOXICILLIN 875 MG PO TABS
875.0000 mg | ORAL_TABLET | Freq: Two times a day (BID) | ORAL | 0 refills | Status: AC
Start: 2021-04-11 — End: 2021-04-14

## 2021-04-11 MED ORDER — PROPOFOL 10 MG/ML IV BOLUS
INTRAVENOUS | Status: DC | PRN
  Administered 2021-04-11: 15 mg via INTRAVENOUS

## 2021-04-11 MED ORDER — CEFAZOLIN SODIUM-DEXTROSE 2-3 GM-%(50ML) IV SOLR
INTRAVENOUS | Status: DC | PRN
  Administered 2021-04-11: 2 g via INTRAVENOUS

## 2021-04-11 MED ORDER — MEPERIDINE HCL 25 MG/ML IJ SOLN: 6.2500 mg | INTRAMUSCULAR | Status: DC | PRN

## 2021-04-11 SURGICAL SUPPLY — 33 items
ATTRACTOMAT 16X20 MAGNETIC DRP (DRAPES) IMPLANT
CANISTER SUCT 1200ML W/VALVE (MISCELLANEOUS) ×2 IMPLANT
COAGULATOR SUCT 8FR VV (MISCELLANEOUS) ×2 IMPLANT
DECANTER SPIKE VIAL GLASS SM (MISCELLANEOUS) IMPLANT
DEFOGGER MIRROR 1QT (MISCELLANEOUS) ×2 IMPLANT
DRSG NASOPORE 8CM (GAUZE/BANDAGES/DRESSINGS) IMPLANT
DRSG TELFA 3X8 NADH (GAUZE/BANDAGES/DRESSINGS) IMPLANT
ELECT REM PT RETURN 9FT ADLT (ELECTROSURGICAL) ×2
ELECTRODE REM PT RTRN 9FT ADLT (ELECTROSURGICAL) ×1 IMPLANT
GLOVE SURG ENC MOIS LTX SZ7.5 (GLOVE) ×2 IMPLANT
GLOVE SURG POLYISO LF SZ7 (GLOVE) ×1 IMPLANT
GLOVE SURG UNDER POLY LF SZ7 (GLOVE) ×1 IMPLANT
GOWN STRL REUS W/ TWL LRG LVL3 (GOWN DISPOSABLE) ×2 IMPLANT
GOWN STRL REUS W/TWL LRG LVL3 (GOWN DISPOSABLE) ×4
NDL HYPO 25X1 1.5 SAFETY (NEEDLE) ×1 IMPLANT
NEEDLE HYPO 25X1 1.5 SAFETY (NEEDLE) ×2 IMPLANT
NS IRRIG 1000ML POUR BTL (IV SOLUTION) ×2 IMPLANT
PACK BASIN DAY SURGERY FS (CUSTOM PROCEDURE TRAY) ×2 IMPLANT
PACK ENT DAY SURGERY (CUSTOM PROCEDURE TRAY) ×2 IMPLANT
PAD DRESSING TELFA 3X8 NADH (GAUZE/BANDAGES/DRESSINGS) IMPLANT
SLEEVE SCD COMPRESS KNEE MED (STOCKING) ×1 IMPLANT
SPLINT NASAL AIRWAY SILICONE (MISCELLANEOUS) ×2 IMPLANT
SPONGE GAUZE 2X2 8PLY STRL LF (GAUZE/BANDAGES/DRESSINGS) ×2 IMPLANT
SPONGE NEURO XRAY DETECT 1X3 (DISPOSABLE) ×2 IMPLANT
SUT CHROMIC 4 0 P 3 18 (SUTURE) ×2 IMPLANT
SUT PLAIN 4 0 ~~LOC~~ 1 (SUTURE) ×2 IMPLANT
SUT PROLENE 3 0 PS 2 (SUTURE) ×2 IMPLANT
SUT VIC AB 4-0 P-3 18XBRD (SUTURE) IMPLANT
SUT VIC AB 4-0 P3 18 (SUTURE)
TOWEL GREEN STERILE FF (TOWEL DISPOSABLE) ×2 IMPLANT
TUBE SALEM SUMP 12R W/ARV (TUBING) IMPLANT
TUBE SALEM SUMP 16 FR W/ARV (TUBING) ×2 IMPLANT
YANKAUER SUCT BULB TIP NO VENT (SUCTIONS) ×2 IMPLANT

## 2021-04-11 NOTE — Anesthesia Procedure Notes (Signed)
Procedure Name: Intubation Date/Time: 04/11/2021 7:43 AM Performed by: Ezequiel Kayser, CRNA Pre-anesthesia Checklist: Patient identified, Emergency Drugs available, Suction available and Patient being monitored Patient Re-evaluated:Patient Re-evaluated prior to induction Oxygen Delivery Method: Circle System Utilized Preoxygenation: Pre-oxygenation with 100% oxygen Induction Type: IV induction Ventilation: Mask ventilation without difficulty Laryngoscope Size: Mac and 4 Grade View: Grade I Tube type: Oral Tube size: 7.0 mm Number of attempts: 1 Airway Equipment and Method: Stylet and Oral airway Placement Confirmation: ETT inserted through vocal cords under direct vision, positive ETCO2 and breath sounds checked- equal and bilateral Secured at: 23 cm Tube secured with: Tape Dental Injury: Teeth and Oropharynx as per pre-operative assessment

## 2021-04-11 NOTE — Anesthesia Preprocedure Evaluation (Signed)
Anesthesia Evaluation  Patient identified by MRN, date of birth, ID band Patient awake    Reviewed: Allergy & Precautions, H&P , NPO status , Patient's Chart, lab work & pertinent test results  Airway Mallampati: II  TM Distance: >3 FB Neck ROM: Full    Dental no notable dental hx.    Pulmonary neg pulmonary ROS,    Pulmonary exam normal breath sounds clear to auscultation       Cardiovascular negative cardio ROS Normal cardiovascular exam Rhythm:Regular Rate:Normal     Neuro/Psych negative neurological ROS  negative psych ROS   GI/Hepatic negative GI ROS, Neg liver ROS,   Endo/Other  negative endocrine ROS  Renal/GU negative Renal ROS  negative genitourinary   Musculoskeletal negative musculoskeletal ROS (+)   Abdominal   Peds negative pediatric ROS (+)  Hematology negative hematology ROS (+)   Anesthesia Other Findings Nasal Obstruction  Reproductive/Obstetrics negative OB ROS                             Anesthesia Physical Anesthesia Plan  ASA: 2  Anesthesia Plan: General   Post-op Pain Management:    Induction: Intravenous  PONV Risk Score and Plan: 2 and Ondansetron, Midazolam and Treatment may vary due to age or medical condition  Airway Management Planned: Oral ETT  Additional Equipment:   Intra-op Plan:   Post-operative Plan: Extubation in OR  Informed Consent: I have reviewed the patients History and Physical, chart, labs and discussed the procedure including the risks, benefits and alternatives for the proposed anesthesia with the patient or authorized representative who has indicated his/her understanding and acceptance.     Dental advisory given  Plan Discussed with: CRNA  Anesthesia Plan Comments:         Anesthesia Quick Evaluation

## 2021-04-11 NOTE — H&P (Signed)
Cc: Chronic nasal obstruction  HPI: The patient is a 45 y/o male who presents today for evaluation of chronic nasal obstruction. The patient is seen in consultation requested by [facility]. The patient has noted issues breathing through his nose for many years, but his symptoms have gotten worse since March. The patient manages okay during the day with steroid nasal sprays but has significant difficulty breathing through his nose at night. The patient notes fullness under his eyes and frequent sinus pressure. He took an antibiotic for 15 days with no change in his symptoms. The patient underwent immunotherapy in the past but is no longer under the care of an allergist. Previous ENT surgery is denied.   The patient's review of systems (constitutional, eyes, ENT, cardiovascular, respiratory, GI, musculoskeletal, skin, neurologic, psychiatric, endocrine, hematologic, allergic) is noted in the ROS questionnaire.  It is reviewed with the patient.   Major events: None.  Ongoing medical problems: None.  Family health history: No HTN, DM, CAD, hearing loss or bleeding disorder.  Social history: The patient is married. He denies the use of tobacco, alcohol or illegal drugs.   Exam: General: Communicates without difficulty, well nourished, no acute distress. Head: Normocephalic, no evidence injury, no tenderness, facial buttresses intact without stepoff. Eyes: PERRL, EOMI. No scleral icterus, conjunctivae clear. Neuro: CN II exam reveals vision grossly intact.  No nystagmus at any point of gaze. Ears: Auricles well formed without lesions.  Ear canals are intact without mass or lesion.  No erythema or edema is appreciated.  The TMs are intact without fluid. Nose: External evaluation reveals normal support and skin without lesions.  Dorsum is intact.  Anterior rhinoscopy reveals congested and edematous mucosa over anterior aspect of the inferior turbinates and nasal septum.  No purulence is noted.  Middle meatus is not well visualized. Oral:  Oral cavity and oropharynx are intact, symmetric, without erythema or edema.  Mucosa is moist without lesions. Neck: Full range of motion without pain.  There is no significant lymphadenopathy.  No masses palpable.  Thyroid bed within normal limits to palpation.  Parotid glands and submandibular glands equal bilaterally without mass.  Trachea is midline. Neuro:  CN 2-12 grossly intact. Gait normal. Vestibular: No nystagmus at any point of gaze. A flexible scope was inserted into the right nasal cavity.  Large septal spur. Endoscopy of the interior nasal cavity, superior, inferior, and middle meatus was performed. The sphenoid-ethmoid recess was examined. Edematous mucosa was noted. No polyp, mass, or lesion was appreciated. Olfactory cleft was clear. Nasopharynx was clear. Turbinates were hypertrophied but without mass. Incomplete response to decongestion. The procedure was repeated on the contralateral side with similar findings.   Assessment  1. Chronic rhinitis without evidence of acute sinusitis. No purulent drainage, polyps, or other suspicious mass or lesion is noted on today's nasal endoscopy. 2. 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 This results in significant (>95%) nasal obstruction.   Plan  1. The physical exam and nasal endoscopy findings are reviewed with the patient.  2. Treatment options include conservative management with daily steroid nasal spray versus septoplasty and bilateral inferior turbinate reduction. The risks, benefits, alternatives, and details of the procedure are reviewed with the patient. Questions are invited and answered. 3. The patient is interested in proceeding with the procedure.  We will schedule the procedure in accordance with the family schedule.

## 2021-04-11 NOTE — Transfer of Care (Signed)
Immediate Anesthesia Transfer of Care Note  Patient: Juan Gutierrez  Procedure(s) Performed: NASAL SEPTOPLASTY WITH TURBINATE REDUCTION (Bilateral: Nose)  Patient Location: PACU  Anesthesia Type:General  Level of Consciousness: drowsy  Airway & Oxygen Therapy: Patient Spontanous Breathing and Patient connected to face mask oxygen  Post-op Assessment: Report given to RN and Post -op Vital signs reviewed and stable  Post vital signs: Reviewed and stable  Last Vitals:  Vitals Value Taken Time  BP 128/86 04/11/21 0845  Temp    Pulse 70 04/11/21 0846  Resp    SpO2 100 % 04/11/21 0846    Last Pain:  Vitals:   04/11/21 0648  TempSrc: Oral  PainSc: 0-No pain         Complications: No notable events documented.

## 2021-04-11 NOTE — Discharge Instructions (Signed)

## 2021-04-11 NOTE — Anesthesia Postprocedure Evaluation (Signed)
Anesthesia Post Note  Patient: Juan Gutierrez  Procedure(s) Performed: NASAL SEPTOPLASTY WITH TURBINATE REDUCTION (Bilateral: Nose)     Patient location during evaluation: PACU Anesthesia Type: General Level of consciousness: awake and alert Pain management: pain level controlled Vital Signs Assessment: post-procedure vital signs reviewed and stable Respiratory status: spontaneous breathing, nonlabored ventilation and respiratory function stable Cardiovascular status: blood pressure returned to baseline and stable Postop Assessment: no apparent nausea or vomiting Anesthetic complications: no   No notable events documented.  Last Vitals:  Vitals:   04/11/21 0930 04/11/21 0953  BP: (!) 143/97 (!) 138/99  Pulse: 68 66  Resp: 13 18  Temp:  (!) 36.2 C  SpO2: 96% 97%    Last Pain:  Vitals:   04/11/21 0953  TempSrc: Oral  PainSc: 2                  Lowella Curb

## 2021-04-11 NOTE — Op Note (Signed)
DATE OF PROCEDURE: 04/11/2021  OPERATIVE REPORT   SURGEON: Newman Pies, MD   PREOPERATIVE DIAGNOSES:  1. Severe nasal septal deviation.  2. Bilateral inferior turbinate hypertrophy.  3. Chronic nasal obstruction.  POSTOPERATIVE DIAGNOSES:  1. Severe nasal septal deviation.  2. Bilateral inferior turbinate hypertrophy.  3. Chronic nasal obstruction.  PROCEDURE PERFORMED:  1. Septoplasty.  2. Bilateral partial inferior turbinate resection.   ANESTHESIA: General endotracheal tube anesthesia.   COMPLICATIONS: None.   ESTIMATED BLOOD LOSS: 150 mL.   INDICATION FOR PROCEDURE: Juan Gutierrez is a 45 y.o. male with a history of chronic nasal obstruction. The patient was treated with antihistamine, decongestant, immunotherapy, and steroid nasal sprays. However, the patient continued to be symptomatic. On examination, the patient was noted to have bilateral severe inferior turbinate hypertrophy and significant nasal septal deviation, causing significant nasal obstruction. Based on the above findings, the decision was made for the patient to undergo the above-stated procedures. The risks, benefits, alternatives, and details of the procedures were discussed with the patient. Questions were invited and answered. Informed consent was obtained.   DESCRIPTION OF PROCEDURE: The patient was taken to the operating room and placed supine on the operating table. General endotracheal tube anesthesia was administered by the anesthesiologist. The patient was positioned, and prepped and draped in the standard fashion for nasal surgery. Pledgets soaked with Afrin were placed in both nasal cavities for decongestion. The pledgets were subsequently removed.   Examination of the nasal cavity revealed a severe nasal septal deviation. 1% lidocaine with 1:100,000 epinephrine was injected onto the nasal septum bilaterally. A hemitransfixion incision was made on the left side. The mucosal flap was carefully elevated on the  left side. A cartilaginous incision was made 1 cm superior to the caudal margin of the nasal septum. Mucosal flap was also elevated on the right side in the similar fashion. It should be noted that due to the severe septal deviation, the deviated portion of the cartilaginous and bony septum had to be removed in piecemeal fashion. Once the deviated portions were removed, a straight midline septum was achieved. The septum was then quilted with 4-0 plain gut sutures. The hemitransfixion incision was closed with interrupted 4-0 chromic sutures.   The inferior one half of both hypertrophied inferior turbinate was crossclamped with a Kelly clamp. The inferior one half of each inferior turbinate was then resected with a pair of cross cutting scissors. Hemostasis was achieved with a suction cautery device.  Doyle splints were applied to the nasal septum.  The care of the patient was turned over to the anesthesiologist. The patient was awakened from anesthesia without difficulty. The patient was extubated and transferred to the recovery room in good condition.   OPERATIVE FINDINGS: Nasal septal deviation and bilateral inferior turbinate hypertrophy.   SPECIMEN: None.   FOLLOWUP CARE: The patient be discharged home once he is awake and alert. The patient will be placed on Percocet 1 tablets p.o. q.4 hours p.r.n. pain, and amoxicillin 875 mg p.o. b.i.d. for 3 days. The patient will follow up in my office in 3 days for splint removal.   Juan Polo Philomena Doheny, MD

## 2021-04-14 ENCOUNTER — Encounter (HOSPITAL_BASED_OUTPATIENT_CLINIC_OR_DEPARTMENT_OTHER): Payer: Self-pay | Admitting: Otolaryngology

## 2021-06-06 ENCOUNTER — Telehealth: Payer: Self-pay | Admitting: Medical

## 2021-06-06 NOTE — Telephone Encounter (Signed)
Pt called and wanted to know if you had any recommendations for a good surgeon that does  Blepharoplasty surgery?

## 2021-06-07 ENCOUNTER — Other Ambulatory Visit: Payer: Self-pay

## 2021-06-07 DIAGNOSIS — J343 Hypertrophy of nasal turbinates: Secondary | ICD-10-CM

## 2021-06-07 NOTE — Telephone Encounter (Signed)
Yes I do blepharoplasty.  Happy to see them.  Thanks.

## 2022-02-07 ENCOUNTER — Encounter: Payer: Self-pay | Admitting: Internal Medicine

## 2023-07-13 ENCOUNTER — Ambulatory Visit: Payer: Self-pay | Admitting: Cardiovascular Disease

## 2023-07-13 ENCOUNTER — Ambulatory Visit (HOSPITAL_COMMUNITY)
Admission: RE | Admit: 2023-07-13 | Discharge: 2023-07-13 | Disposition: A | Discharge status: Home / Self Care | Source: Ambulatory Visit | Attending: Cardiovascular Disease | Admitting: Cardiovascular Disease

## 2023-07-13 ENCOUNTER — Encounter: Payer: Self-pay | Admitting: Cardiovascular Disease

## 2023-07-13 ENCOUNTER — Ambulatory Visit
Admission: RE | Admit: 2023-07-13 | Discharge: 2023-07-13 | Disposition: A | Discharge status: Home / Self Care | Source: Ambulatory Visit | Attending: Cardiovascular Disease | Admitting: Cardiovascular Disease

## 2023-07-13 VITALS — BP 126/86 | HR 62 | Ht 74.0 in | Wt 189.2 lb

## 2023-07-13 DIAGNOSIS — E7800 Pure hypercholesterolemia, unspecified: Secondary | ICD-10-CM | POA: Diagnosis present

## 2023-07-13 DIAGNOSIS — G459 Transient cerebral ischemic attack, unspecified: Secondary | ICD-10-CM | POA: Diagnosis present

## 2023-07-13 DIAGNOSIS — Z7689 Persons encountering health services in other specified circumstances: Secondary | ICD-10-CM

## 2023-07-13 DIAGNOSIS — I444 Left anterior fascicular block: Secondary | ICD-10-CM

## 2023-07-13 NOTE — Progress Notes (Addendum)
 Cardiology Office Note:    Date:  07/13/2023   ID:  Juan Gutierrez, DOB Jan 26, 1976, MRN [id number]  PCP:  Juan Del   [facility] Gut[facility] Cardiologist:  None     Referring MD: Jac Canavan, PA-C   Chief Complaint  Patient presents with   Transient Ischemic Attack    History of Present Illness:    Juan Gutierrez is a 48 y.o. male with a hx of excellent health who experienced brief neurological abnormalities 3 days ago.  He was walking towards his home when he suddenly could not feel his car keys in his right hand.  He moved the car keys to his other hand and then he noticed numbness in the right side of his face.  The face felt like it was drooping.  He went to look at himself in the mirror and did not see any facial droop, but did not completely lose the strength in his right hand and was unable to grasp any objects.  He did not have any change in his speech which remained fluent and intelligible.  The whole episode lasted for a couple of minutes.  It has not recurred since.  He has never had symptoms like this before.  He did not have a headache, visual changes, gait disturbance or balance problems.  He is fit.  He used to run track during college years.  He now exercises about twice a week on the treadmill and with weights.  He does not have any shortness of breath or chest pain at rest or during activity.  He has not had any problems with palpitations, dizziness or syncope.  He has never smoked cigarettes but "vapes once in a while" maybe every other day.  He does not have diabetes mellitus or hypertension.  Both his parents are alive and healthy at age is 48 and 10 respectively.  There is no family history of early vascular disease.  A lipid profile performed in 2022 shows modestly elevated cholesterol at 207 with LDL 134 and HDL 54, normal triglycerides.  All his other labs were normal.  His ECG today shows sinus rhythm with incomplete right bundle branch  block and left anterior fascicular block but is otherwise a normal tracing.  Past Medical History:  Diagnosis Date   Deviated septum     Past Surgical History:  Procedure Laterality Date   NASAL SEPTOPLASTY W/ TURBINOPLASTY Bilateral 04/11/2021   Procedure: NASAL SEPTOPLASTY WITH TURBINATE REDUCTION;  Surgeon: Newman Pies, MD;  Location: [facility];  Service: ENT;  Laterality: Bilateral;    Current Medications: Current Meds  Medication Sig   Fexofenadine-Pseudoephedrine (ALLEGRA-D PO) Take by mouth.     Allergies:   Patient has no known allergies.   Family History: The patient's family history is significant negative for stroke, CAD, other vascular disease.  ROS:   Please see the history of present illness.     All other systems reviewed and are negative.  EKGs/Labs/Other Studies Reviewed:    The following studies were reviewed today:  EKG Interpretation Date/Time:  Friday July 13 2023 10:12:17 EDT Ventricular Rate:  62 PR Interval:  138 QRS Duration:  86 QT Interval:  380 QTC Calculation: 385 R Axis:   -78  Text Interpretation: Normal sinus rhythm Left axis deviation Left anterior fasicular block No previous ECGs available Confirmed by Kashlyn Salinas ([id number]) on 07/13/2023 5:19:12 PM    Recent Labs: No results found for requested labs within last 365 days.  Recent Lipid Panel    Component Value Date/Time   CHOL 207 (H) 02/17/2021 1422   TRIG 108 02/17/2021 1422   HDL 54 02/17/2021 1422   CHOLHDL 3.8 02/17/2021 1422   LDLCALC 134 (H) 02/17/2021 1422     Risk Assessment/Calculations:                Physical Exam:    VS:  BP 126/86 (BP Location: Left Arm, Patient Position: Sitting, Cuff Size: Normal)   Pulse 62   Ht 6\' 2"  (1.88 m)   Wt 189 lb 3.2 oz (85.8 kg)   SpO2 97%   BMI 24.29 kg/m     Wt Readings from Last 3 Encounters:  07/13/23 189 lb 3.2 oz (85.8 kg)  04/11/21 182 lb 12.2 oz (82.9 kg)  02/23/21 183 lb (83 kg)     GEN:  He appears physically fit, mean, well nourished, well developed in no acute distress HEENT: Normal.  Normal pupillary response to light and extraocular movements NECK: No JVD; No carotid bruits LYMPHATICS: No lymphadenopathy CARDIAC: RRR, no murmurs, rubs, gallops.  He has bounding pulses in the radials, ulnar dorsalis pedis and posterior tibials bilaterally. RESPIRATORY:  Clear to auscultation without rales, wheezing or rhonchi  ABDOMEN: Soft, non-tender, non-distended MUSCULOSKELETAL:  No edema; No deformity  SKIN: Warm and dry NEUROLOGIC:  Alert and oriented x 3 cranial nerves II through XII appear intact.  Normal reflexes. PSYCHIATRIC:  Normal affect   ASSESSMENT:    1. TIA (transient ischemic attack)   2. Hypercholesterolemia   3. Establishing care with new doctor, encounter for   4. LAFB (left anterior fascicular block)    PLAN:    In order of problems listed above:  TIA: He describes a very typical transischemic attack in the distribution of the left middle cerebral artery.  Thankfully the symptoms were brief.  He has very limited risk factors for vascular disease (just hypercholesterolemia).  Will send him for a CT of the head and plan carotid duplex ultrasonography and echocardiography.  Recommend aspirin 81 mg once daily as long as there is no evidence of intracranial hemorrhage (very doubtful this would be the problem).  If the workup is negative would still probably recommend evaluation by a neurologist. HLP: He has moderate elevation of LDL cholesterol.  If we do not find evidence of vascular disease I would focus on changes in diet and increased frequency of exercise.  If we do find any evidence of vascular disease recommend statin therapy with target LDL less than 70. LAFB: And incomplete right bundle branch block.  These are not necessarily sign of serious problem but are unexpected at his age.  Evaluate echocardiogram.           Medication Adjustments/Labs and Tests  Ordered: Current medicines are reviewed at length with the patient today.  Concerns regarding medicines are outlined above.  Orders Placed This Encounter  Procedures   US Carotid Duplex Bilateral   CT HEAD WO CONTRAST ( )   CBC   Lipid panel   Lipoprotein A (LPA)   Basic Metabolic Panel (BMET)   EKG 12-Lead   ECHOCARDIOGRAM COMPLETE   VAS US CAROTID   No orders of the defined types were placed in this encounter.   Patient Instructions  Medication Instructions:  Your physician recommends that you continue on your current medications as directed. Please refer to the Current Medication list given to you today.    *If you need a refill on your cardiac medications  before your next appointment, please call your pharmacy*   Lab Work: Complete Blood Count  Lipid Panel  Lp(a) Basic Metabolic Panel    If you have labs (blood work) drawn today and your tests are completely normal, you will receive your results only by: MyChart Message (if you have MyChart) OR A paper copy in the mail If you have any lab test that is abnormal or we need to change your treatment, we will call you to review the results.   Testing/Procedures:   Echo will be scheduled at [address].  Your physician has requested that you have an echocardiogram. Echocardiography is a painless test that uses sound waves to create images of your heart. It provides your doctor with information about the size and shape of your heart and how well your heart's chambers and valves are working. This procedure takes approximately one hour. There are no restrictions for this procedure. Please do NOT wear cologne, perfume, aftershave, or lotions (deodorant is allowed). Please arrive 15 minutes prior to your appointment time.    Your physician has requested that you have a CAROTID DUPLEX.This test is an ultrasound of the carotid arteries in your neck. It looks at blood flow through these arteries that supply the  brain with blood. Allow one hour for this exam. There are no restrictions or special instructions.   Dr. Royann Shivers has ordered a CT scan of your brain. This will be an imaging test.      Follow-Up: At [facility], you and your health needs are our priority.  As part of our continuing mission to provide you with exceptional heart care, we have created designated Provider Care Teams.  These Care Teams include your primary Cardiologist (physician) and Advanced Practice Gutierrez (APPs -  Physician Assistants and Nurse Practitioners) who all work together to provide you with the care you need, when you need it.  We recommend signing up for the patient portal called "MyChart".  Sign up information is provided on this After Visit Summary.  MyChart is used to connect with patients for Virtual Visits (Telemedicine).  Patients are able to view lab/test results, encounter notes, upcoming appointments, etc.  Non-urgent messages can be sent to your provider as well.   To learn more about what you can do with MyChart, go to [email or web address].    Your next appointment:   6 week(s)  The format for your next appointment:   In Person  Provider:   [facility] Dmitri Pettigrew    Other Instructions    Signed, Thurmon Fair, MD  07/13/2023 5:19 PM    [facility]

## 2023-07-13 NOTE — Patient Instructions (Addendum)
 Medication Instructions:  Your physician recommends that you continue on your current medications as directed. Please refer to the Current Medication list given to you today.    *If you need a refill on your cardiac medications before your next appointment, please call your pharmacy*   Lab Work: Complete Blood Count  Lipid Panel  Lp(a) Basic Metabolic Panel    If you have labs (blood work) drawn today and your tests are completely normal, you will receive your results only by: MyChart Message (if you have MyChart) OR A paper copy in the mail If you have any lab test that is abnormal or we need to change your treatment, we will call you to review the results.   Testing/Procedures:   Echo will be scheduled at [address].  Your physician has requested that you have an echocardiogram. Echocardiography is a painless test that uses sound waves to create images of your heart. It provides your doctor with information about the size and shape of your heart and how well your heart's chambers and valves are working. This procedure takes approximately one hour. There are no restrictions for this procedure. Please do NOT wear cologne, perfume, aftershave, or lotions (deodorant is allowed). Please arrive 15 minutes prior to your appointment time.    Your physician has requested that you have a CAROTID DUPLEX.This test is an ultrasound of the carotid arteries in your neck. It looks at blood flow through these arteries that supply the brain with blood. Allow one hour for this exam. There are no restrictions or special instructions.   Dr. Royann Shivers has ordered a CT scan of your brain. This will be an imaging test.      Follow-Up: At [facility], you and your health needs are our priority.  As part of our continuing mission to provide you with exceptional heart care, we have created designated Provider Care Teams.  These Care Teams include your primary Cardiologist (physician)  and Advanced Practice Providers (APPs -  Physician Assistants and Nurse Practitioners) who all work together to provide you with the care you need, when you need it.  We recommend signing up for the patient portal called "MyChart".  Sign up information is provided on this After Visit Summary.  MyChart is used to connect with patients for Virtual Visits (Telemedicine).  Patients are able to view lab/test results, encounter notes, upcoming appointments, etc.  Non-urgent messages can be sent to your provider as well.   To learn more about what you can do with MyChart, go to [email or web address].    Your next appointment:   6 week(s)  The format for your next appointment:   In Person  Provider:   [facility] CROIT[facility]U    Other Instructions

## 2023-07-14 ENCOUNTER — Encounter: Payer: Self-pay | Admitting: Cardiovascular Disease

## 2023-07-15 LAB — BASIC METABOLIC PANEL
BUN/Creatinine Ratio: 9 (ref 9–20)
BUN: 11 mg/dL (ref 6–24)
CO2: 24 mmol/L (ref 20–29)
Calcium: 9.6 mg/dL (ref 8.7–10.2)
Chloride: 101 mmol/L (ref 96–106)
Creatinine, Ser: 1.16 mg/dL (ref 0.76–1.27)
Glucose: 84 mg/dL (ref 70–99)
Potassium: 4.7 mmol/L (ref 3.5–5.2)
Sodium: 139 mmol/L (ref 134–144)
eGFR: 79 mL/min/{1.73_m2} (ref >59)

## 2023-07-15 LAB — CBC
Hematocrit: 45.8 % (ref 37.5–51.0)
Hemoglobin: 15.2 g/dL (ref 13.0–17.7)
MCH: 28.6 pg (ref 26.6–33.0)
MCHC: 33.2 g/dL (ref 31.5–35.7)
MCV: 86 fL (ref 79–97)
Platelets: 287 10*3/uL (ref 150–450)
RBC: 5.32 x10E6/uL (ref 4.14–5.80)
RDW: 12.5 % (ref 11.6–15.4)
WBC: 3.9 10*3/uL (ref 3.4–10.8)

## 2023-07-15 LAB — LIPID PANEL
Chol/HDL Ratio: 4.1 ratio (ref 0.0–5.0)
Cholesterol, Total: 217 mg/dL — ABNORMAL HIGH (ref 100–199)
HDL: 53 mg/dL (ref >39)
LDL Chol Calc (NIH): 152 mg/dL — ABNORMAL HIGH (ref 0–99)
Triglycerides: 67 mg/dL (ref 0–149)
VLDL Cholesterol Cal: 12 mg/dL (ref 5–40)

## 2023-07-15 LAB — LIPOPROTEIN A (LPA): Lipoprotein (a): 60.2 nmol/L (ref <75.0)

## 2023-07-16 ENCOUNTER — Telehealth: Payer: Self-pay | Admitting: *Deleted

## 2023-07-16 ENCOUNTER — Other Ambulatory Visit (HOSPITAL_COMMUNITY): Payer: Self-pay

## 2023-07-16 DIAGNOSIS — E7800 Pure hypercholesterolemia, unspecified: Secondary | ICD-10-CM

## 2023-07-16 MED ORDER — ROSUVASTATIN CALCIUM 10 MG PO TABS
10.0000 mg | ORAL_TABLET | Freq: Every day | ORAL | 3 refills | Status: AC
  Filled 2023-07-16 – 2023-07-28 (×2): qty 90, 90d supply, fill #0

## 2023-07-16 NOTE — Telephone Encounter (Signed)
 Pt has reviewed results via my chart  New script sent to the pharmacy  Lab orders mailed to the pt

## 2023-07-16 NOTE — Telephone Encounter (Signed)
-----   Message from [facility] sent at 07/15/2023 10:49 PM EDT ----- Cholesterol, in particular the LDL cholesterol ("bad cholesterol") is high, but all other labs are normal.  Since Juan Gutierrez is already lean and appears pretty fit, unlikely will make a big change in his cholesterol numbers without medication.  Did not find any evidence of atherosclerosis on his workup so far, but would still recommend trying to bring the LDL down to less than 100.  Would like to start rosuvastatin 10 mg once daily.  Repeat a lipid profile in 3 months.

## 2023-07-20 ENCOUNTER — Ambulatory Visit (HOSPITAL_COMMUNITY): Attending: Cardiovascular Disease

## 2023-07-20 ENCOUNTER — Encounter: Payer: Self-pay | Admitting: Cardiovascular Disease

## 2023-07-20 DIAGNOSIS — G459 Transient cerebral ischemic attack, unspecified: Secondary | ICD-10-CM

## 2023-07-20 DIAGNOSIS — E7800 Pure hypercholesterolemia, unspecified: Secondary | ICD-10-CM | POA: Insufficient documentation

## 2023-07-20 DIAGNOSIS — Z7689 Persons encountering health services in other specified circumstances: Secondary | ICD-10-CM | POA: Insufficient documentation

## 2023-07-20 LAB — ECHOCARDIOGRAM COMPLETE
Area-P 1/2: 3.17 cm2
S' Lateral: 2.7 cm

## 2023-07-26 ENCOUNTER — Other Ambulatory Visit (HOSPITAL_COMMUNITY): Payer: Self-pay

## 2023-07-28 ENCOUNTER — Other Ambulatory Visit (HOSPITAL_COMMUNITY): Payer: Self-pay

## 2023-11-30 LAB — LIPID PANEL
Chol/HDL Ratio: 5.3 ratio — ABNORMAL HIGH (ref 0.0–5.0)
Cholesterol, Total: 281 mg/dL — ABNORMAL HIGH (ref 100–199)
HDL: 53 mg/dL (ref >39)
LDL Chol Calc (NIH): 209 mg/dL — ABNORMAL HIGH (ref 0–99)
Triglycerides: 108 mg/dL (ref 0–149)
VLDL Cholesterol Cal: 19 mg/dL (ref 5–40)

## 2024-05-12 ENCOUNTER — Encounter: Payer: Self-pay | Admitting: Medical

## 2024-05-12 ENCOUNTER — Ambulatory Visit: Admitting: Medical

## 2024-05-12 VITALS — BP 122/76 | HR 76 | Wt 189.8 lb

## 2024-05-12 DIAGNOSIS — E785 Hyperlipidemia, unspecified: Secondary | ICD-10-CM

## 2024-05-12 DIAGNOSIS — R519 Headache, unspecified: Secondary | ICD-10-CM | POA: Diagnosis not present

## 2024-05-12 DIAGNOSIS — G8929 Other chronic pain: Secondary | ICD-10-CM | POA: Diagnosis not present

## 2024-05-12 DIAGNOSIS — R6889 Other general symptoms and signs: Secondary | ICD-10-CM

## 2024-05-12 MED ORDER — TOPIRAMATE 25 MG PO TABS: 25.0000 mg | ORAL_TABLET | Freq: Every day | ORAL | 0 refills | Status: DC

## 2024-05-12 NOTE — Progress Notes (Signed)
 "  Name: Juan Gutierrez   Date of Visit: 05/12/2024   Date of last visit with me: Visit date not found   CHIEF COMPLAINT:  Chief Complaint  Patient presents with   other    Sensitivity to light worse now, being in the shade helps, has had a few migraines in the morning, started about 5 years ago, tented windows helped,        HPI:  Discussed the use of AI scribe software for clinical note transcription with the patient, who gave verbal consent to proceed.  History of Present Illness   Juan Gutierrez is a 49 year old male who presents with sensitivity to light and headaches.  He experiences sensitivity to bright daylight, which has been an intermittent issue for years but has become more problematic recently. His vision is significantly affected by bright light, particularly when driving cars without tinted windows, describing the vision as 'hazy'. Tinted windows help alleviate the issue. This sensitivity has been present since he first got his prescription glasses, which he uses to see further. He also experiences glare from car headlights at night, which has been bothersome. His work schedule has changed, requiring him to be out during the day more often, exacerbating the issue.  He experiences headaches, particularly in the mornings, which tend to lessen by lunchtime. He tries to manage these symptoms by drinking more water, suspecting dehydration, but avoids taking medication. Regular headaches usually occur in the mornings and lessen by midday. These headaches have been a long-standing issue, occurring continuously over several days but not lasting all day. He describes them as starting at a 'seven' on a pain scale and reducing to a 'five', more of a nuisance than severe pain. The headaches are worse when exposed to sunlight, particularly in the summer and spring, and are less severe on cloudy days. Squeezing the bridge of his nose can relieve some pressure from the headaches, and he  experiences watery eyes during these episodes.  In March of last year, he experienced numbness in his hand and warmth on the left side of his face, which led to a cardiology consultation due to concerns about a mini-stroke. His cholesterol was found to be high, and he was prescribed medication, though he is unsure if he was retested after starting the medication. A head CT scan from last March was negative. There is a family history of stroke, as his mother recently had a stroke.  He has not seen an eye doctor since he got his glasses, which was either at the beginning of last year or the end of 2024.  No other aggravating or relieving factors. No other complaint.   Past Medical History:  Diagnosis Date   Deviated septum    Medications Ordered Prior to Encounter[1]  ROS as in subjective   Objective: BP 122/76   Pulse 76   Wt 189 lb 12.8 oz (86.1 kg)   BMI 24.37 kg/m   General appearence: alert, no distress, WD/WN,  HEENT: normocephalic, sclerae anicteric, PERRLA, EOMi, nares patent, no discharge or erythema, pharynx normal Oral cavity: MMM, no lesions Neck: supple, no lymphadenopathy, no thyromegaly, no masses, no bruits Heart: RRR, normal S1, S2, no murmurs Lungs: CTA bilaterally, no wheezes, rhonchi, or rales Extremities: no edema, no cyanosis, no clubbing Pulses: 2+ symmetric, upper and lower extremities, normal cap refill Neurological: alert, oriented x 3, CN2-12 intact, strength normal upper extremities and lower extremities, sensation normal throughout, DTRs 2+ throughout, no cerebellar signs, gait normal Psychiatric:  normal affect, behavior normal, pleasant     Assessment: Encounter Diagnoses  Name Primary?   Light sensitivity Yes   Chronic nonintractable headache, unspecified headache type    Hyperlipidemia, unspecified hyperlipidemia type      Plan: Chronic headaches and light sensitivity Chronic migraines with photophobia, frequent and bothersome, but not  severe or significantly affecting daily activities. Differential includes atypical migraine or other neurological conditions. Previous head CT negative 06/2023. Discussed Topamax  side effects. - advised he follow soon with ophthalmologist for eye examination and visual field assessment.  Gave list of eye doctors - begin trial of Topamax .  Prescribed Topamax  25 mg once daily at bedtime. - Consider MRI or MRA if ophthalmologist findings are concerning or symptoms persist. - Advised lifestyle modifications to avoid bright light exposure.  Hyperlipidemia Elevated cholesterol levels with no recent lipid panel. Discussed importance of managing cholesterol to prevent complications. - nonfasting today.  Return soon for fasting recheck  General Health Maintenance Routine health maintenance due, including cancer screenings. - Will schedule physical examination and fasting labs soon, including prostate and colon cancer screenings. - Will ensure routine health maintenance is up to date.   Juan Gutierrez was seen today for other.  Diagnoses and all orders for this visit:  Light sensitivity  Chronic nonintractable headache, unspecified headache type  Hyperlipidemia, unspecified hyperlipidemia type  Other orders -     topiramate  (TOPAMAX ) 25 MG tablet; Take 1 tablet (25 mg total) by mouth daily.    F/u 70mo       [1]  Current Outpatient Medications on File Prior to Visit  Medication Sig Dispense Refill   Fexofenadine-Pseudoephedrine (ALLEGRA-D PO) Take by mouth. (Patient taking differently: Take by mouth. prn)     rosuvastatin  (CRESTOR ) 10 MG tablet Take 1 tablet (10 mg total) by mouth daily. (Patient not taking: Reported on 05/12/2024) 90 tablet 3   No current facility-administered medications on file prior to visit.   "

## 2024-05-12 NOTE — Patient Instructions (Signed)
Ophthalmologists Nearby  [facility] [address], [place], [place] [address] [phone]  [facility] [address], [place], [place] [address] [phone]  [facility] [address], [place], [place] [address] [phone]  [facility]  [address], [place], [place] [address] [phone]  [facility] [address], [place], [place] [address] [phone]  

## 2024-05-13 ENCOUNTER — Other Ambulatory Visit: Payer: Self-pay | Admitting: Medical

## 2024-06-26 ENCOUNTER — Encounter: Admitting: Medical
